# Patient Record
Sex: Female | Born: 1950 | Race: White | Hispanic: No | Marital: Married | Smoking: Never smoker
Health system: Southern US, Community
[De-identification: ages and names within clinical notes are randomized; demographics above are authoritative.]

## PROBLEM LIST (undated history)

## (undated) HISTORY — PX: OVARIAN CYST REMOVAL: SHX89

## (undated) HISTORY — PX: PROLAPSED UTERINE FIBROID LIGATION: SHX5400

---

## 1999-03-27 ENCOUNTER — Encounter: Payer: Self-pay | Admitting: Family Medicine

## 1999-03-27 ENCOUNTER — Encounter: Admission: RE | Admit: 1999-03-27 | Discharge: 1999-03-27 | Payer: Self-pay | Admitting: Family Medicine

## 2000-08-04 ENCOUNTER — Encounter: Payer: Self-pay | Admitting: Family Medicine

## 2000-08-04 ENCOUNTER — Encounter: Admission: RE | Admit: 2000-08-04 | Discharge: 2000-08-04 | Payer: Self-pay | Admitting: Family Medicine

## 2000-08-06 ENCOUNTER — Encounter: Payer: Self-pay | Admitting: Family Medicine

## 2000-08-06 ENCOUNTER — Encounter: Admission: RE | Admit: 2000-08-06 | Discharge: 2000-08-06 | Payer: Self-pay | Admitting: Family Medicine

## 2001-10-05 ENCOUNTER — Encounter: Admission: RE | Admit: 2001-10-05 | Discharge: 2001-10-05 | Payer: Self-pay | Admitting: Family Medicine

## 2001-10-05 ENCOUNTER — Encounter: Payer: Self-pay | Admitting: Family Medicine

## 2002-10-12 ENCOUNTER — Encounter: Admission: RE | Admit: 2002-10-12 | Discharge: 2002-10-12 | Payer: Self-pay | Admitting: Family Medicine

## 2002-10-12 ENCOUNTER — Encounter: Payer: Self-pay | Admitting: Family Medicine

## 2004-03-13 ENCOUNTER — Other Ambulatory Visit: Admission: RE | Admit: 2004-03-13 | Discharge: 2004-03-13 | Payer: Self-pay | Admitting: Family Medicine

## 2004-03-18 ENCOUNTER — Encounter: Admission: RE | Admit: 2004-03-18 | Discharge: 2004-03-18 | Payer: Self-pay | Admitting: Family Medicine

## 2004-04-07 ENCOUNTER — Emergency Department (HOSPITAL_COMMUNITY): Admission: EM | Admit: 2004-04-07 | Discharge: 2004-04-07 | Payer: Self-pay | Admitting: Emergency Medicine

## 2005-04-30 ENCOUNTER — Encounter: Admission: RE | Admit: 2005-04-30 | Discharge: 2005-04-30 | Payer: Self-pay | Admitting: Family Medicine

## 2005-05-12 ENCOUNTER — Other Ambulatory Visit: Admission: RE | Admit: 2005-05-12 | Discharge: 2005-05-12 | Payer: Self-pay | Admitting: Family Medicine

## 2005-05-28 ENCOUNTER — Encounter: Admission: RE | Admit: 2005-05-28 | Discharge: 2005-05-28 | Payer: Self-pay | Admitting: Family Medicine

## 2006-05-13 ENCOUNTER — Other Ambulatory Visit: Admission: RE | Admit: 2006-05-13 | Discharge: 2006-05-13 | Payer: Self-pay | Admitting: Family Medicine

## 2006-06-22 ENCOUNTER — Encounter: Admission: RE | Admit: 2006-06-22 | Discharge: 2006-06-22 | Payer: Self-pay | Admitting: Family Medicine

## 2007-05-27 ENCOUNTER — Other Ambulatory Visit: Admission: RE | Admit: 2007-05-27 | Discharge: 2007-05-27 | Payer: Self-pay | Admitting: Family Medicine

## 2007-08-04 ENCOUNTER — Encounter: Admission: RE | Admit: 2007-08-04 | Discharge: 2007-08-04 | Payer: Self-pay | Admitting: Family Medicine

## 2008-07-04 ENCOUNTER — Encounter: Admission: RE | Admit: 2008-07-04 | Discharge: 2008-07-04 | Payer: Self-pay | Admitting: Family Medicine

## 2009-01-24 ENCOUNTER — Encounter: Admission: RE | Admit: 2009-01-24 | Discharge: 2009-01-24 | Payer: Self-pay | Admitting: Family Medicine

## 2009-12-13 ENCOUNTER — Encounter: Admission: RE | Admit: 2009-12-13 | Discharge: 2009-12-13 | Payer: Self-pay | Admitting: Family Medicine

## 2010-04-11 ENCOUNTER — Emergency Department (HOSPITAL_COMMUNITY)
Admission: EM | Admit: 2010-04-11 | Discharge: 2010-04-12 | Payer: Self-pay | Source: Home / Self Care | Admitting: Emergency Medicine

## 2012-05-30 ENCOUNTER — Other Ambulatory Visit (HOSPITAL_COMMUNITY)
Admission: RE | Admit: 2012-05-30 | Discharge: 2012-05-30 | Disposition: A | Discharge status: Home / Self Care | Payer: Self-pay | Source: Ambulatory Visit | Attending: Family Medicine | Admitting: Family Medicine

## 2012-05-30 ENCOUNTER — Other Ambulatory Visit: Payer: Self-pay | Admitting: Family Medicine

## 2012-05-30 DIAGNOSIS — M810 Age-related osteoporosis without current pathological fracture: Secondary | ICD-10-CM

## 2012-05-30 DIAGNOSIS — Z124 Encounter for screening for malignant neoplasm of cervix: Secondary | ICD-10-CM | POA: Insufficient documentation

## 2012-06-23 ENCOUNTER — Other Ambulatory Visit: Payer: Self-pay

## 2012-06-27 ENCOUNTER — Other Ambulatory Visit: Payer: Self-pay

## 2012-07-05 ENCOUNTER — Ambulatory Visit
Admission: RE | Admit: 2012-07-05 | Discharge: 2012-07-05 | Disposition: A | Discharge status: Home / Self Care | Payer: BC Managed Care – PPO | Source: Ambulatory Visit | Attending: Family Medicine | Admitting: Family Medicine

## 2012-07-05 DIAGNOSIS — M810 Age-related osteoporosis without current pathological fracture: Secondary | ICD-10-CM

## 2013-03-06 ENCOUNTER — Other Ambulatory Visit: Payer: Self-pay

## 2013-03-06 DIAGNOSIS — Z1231 Encounter for screening mammogram for malignant neoplasm of breast: Secondary | ICD-10-CM

## 2013-03-27 ENCOUNTER — Ambulatory Visit
Admission: RE | Admit: 2013-03-27 | Discharge: 2013-03-27 | Disposition: A | Discharge status: Home / Self Care | Payer: BC Managed Care – PPO | Source: Ambulatory Visit

## 2013-03-27 DIAGNOSIS — Z1231 Encounter for screening mammogram for malignant neoplasm of breast: Secondary | ICD-10-CM

## 2013-05-31 ENCOUNTER — Other Ambulatory Visit: Payer: Self-pay | Admitting: Family Medicine

## 2013-05-31 ENCOUNTER — Ambulatory Visit
Admission: RE | Admit: 2013-05-31 | Discharge: 2013-05-31 | Disposition: A | Discharge status: Home / Self Care | Payer: BC Managed Care – PPO | Source: Ambulatory Visit | Attending: Family Medicine | Admitting: Family Medicine

## 2013-05-31 DIAGNOSIS — R52 Pain, unspecified: Secondary | ICD-10-CM

## 2015-05-31 ENCOUNTER — Other Ambulatory Visit: Payer: Self-pay

## 2015-05-31 DIAGNOSIS — Z1231 Encounter for screening mammogram for malignant neoplasm of breast: Secondary | ICD-10-CM

## 2015-06-12 ENCOUNTER — Ambulatory Visit
Admission: RE | Admit: 2015-06-12 | Discharge: 2015-06-12 | Disposition: A | Discharge status: Home / Self Care | Payer: BC Managed Care – PPO | Source: Ambulatory Visit

## 2015-06-12 DIAGNOSIS — Z1231 Encounter for screening mammogram for malignant neoplasm of breast: Secondary | ICD-10-CM

## 2016-11-24 ENCOUNTER — Other Ambulatory Visit (HOSPITAL_COMMUNITY)
Admission: RE | Admit: 2016-11-24 | Discharge: 2016-11-24 | Disposition: A | Discharge status: Home / Self Care | Payer: BC Managed Care – PPO | Source: Ambulatory Visit | Attending: Obstetrics and Gynecology | Admitting: Obstetrics and Gynecology

## 2016-11-24 ENCOUNTER — Other Ambulatory Visit: Payer: Self-pay | Admitting: Nurse Practitioner

## 2016-11-24 DIAGNOSIS — Z124 Encounter for screening for malignant neoplasm of cervix: Secondary | ICD-10-CM | POA: Diagnosis present

## 2016-11-27 LAB — CYTOLOGY - PAP
Diagnosis: NEGATIVE
HPV (WINDOPATH): NOT DETECTED

## 2017-10-04 ENCOUNTER — Other Ambulatory Visit: Payer: Self-pay | Admitting: Family Medicine

## 2017-10-04 DIAGNOSIS — Z1231 Encounter for screening mammogram for malignant neoplasm of breast: Secondary | ICD-10-CM

## 2017-10-28 ENCOUNTER — Ambulatory Visit
Admission: RE | Admit: 2017-10-28 | Discharge: 2017-10-28 | Disposition: A | Discharge status: Home / Self Care | Payer: BC Managed Care – PPO | Source: Ambulatory Visit | Attending: Family Medicine | Admitting: Family Medicine

## 2017-10-28 DIAGNOSIS — Z1231 Encounter for screening mammogram for malignant neoplasm of breast: Secondary | ICD-10-CM

## 2018-12-20 ENCOUNTER — Other Ambulatory Visit: Payer: Self-pay | Admitting: Family Medicine

## 2018-12-20 DIAGNOSIS — Z1231 Encounter for screening mammogram for malignant neoplasm of breast: Secondary | ICD-10-CM

## 2019-02-02 ENCOUNTER — Other Ambulatory Visit: Payer: Self-pay

## 2019-02-02 ENCOUNTER — Ambulatory Visit
Admission: RE | Admit: 2019-02-02 | Discharge: 2019-02-02 | Disposition: A | Discharge status: Home / Self Care | Payer: BC Managed Care – PPO | Source: Ambulatory Visit | Attending: Family Medicine | Admitting: Family Medicine

## 2019-02-02 DIAGNOSIS — Z1231 Encounter for screening mammogram for malignant neoplasm of breast: Secondary | ICD-10-CM

## 2020-01-26 ENCOUNTER — Other Ambulatory Visit: Payer: Self-pay | Admitting: Family Medicine

## 2020-01-26 DIAGNOSIS — Z1231 Encounter for screening mammogram for malignant neoplasm of breast: Secondary | ICD-10-CM

## 2020-02-15 ENCOUNTER — Ambulatory Visit: Payer: BC Managed Care – PPO

## 2020-02-19 ENCOUNTER — Other Ambulatory Visit: Payer: Self-pay | Admitting: Family Medicine

## 2020-02-19 DIAGNOSIS — E2839 Other primary ovarian failure: Secondary | ICD-10-CM

## 2020-02-20 ENCOUNTER — Other Ambulatory Visit: Payer: Self-pay

## 2020-02-20 ENCOUNTER — Other Ambulatory Visit: Payer: Self-pay | Admitting: Family Medicine

## 2020-02-20 ENCOUNTER — Ambulatory Visit
Admission: RE | Admit: 2020-02-20 | Discharge: 2020-02-20 | Disposition: A | Discharge status: Home / Self Care | Payer: BC Managed Care – PPO | Source: Ambulatory Visit | Attending: Family Medicine | Admitting: Family Medicine

## 2020-02-20 DIAGNOSIS — Z1231 Encounter for screening mammogram for malignant neoplasm of breast: Secondary | ICD-10-CM

## 2020-05-30 ENCOUNTER — Other Ambulatory Visit: Payer: BC Managed Care – PPO

## 2020-08-28 ENCOUNTER — Other Ambulatory Visit: Payer: BC Managed Care – PPO

## 2020-09-03 IMAGING — MG MM DIGITAL SCREENING BILAT W/ TOMO W/ CAD
6 of 10 series · 6 of 30 positions shown · non-contrast
Comparison: Previous exam(s).

CLINICAL DATA: Screening.

EXAM:
DIGITAL SCREENING BILATERAL MAMMOGRAM WITH TOMO AND CAD

[R CC synth-2D]
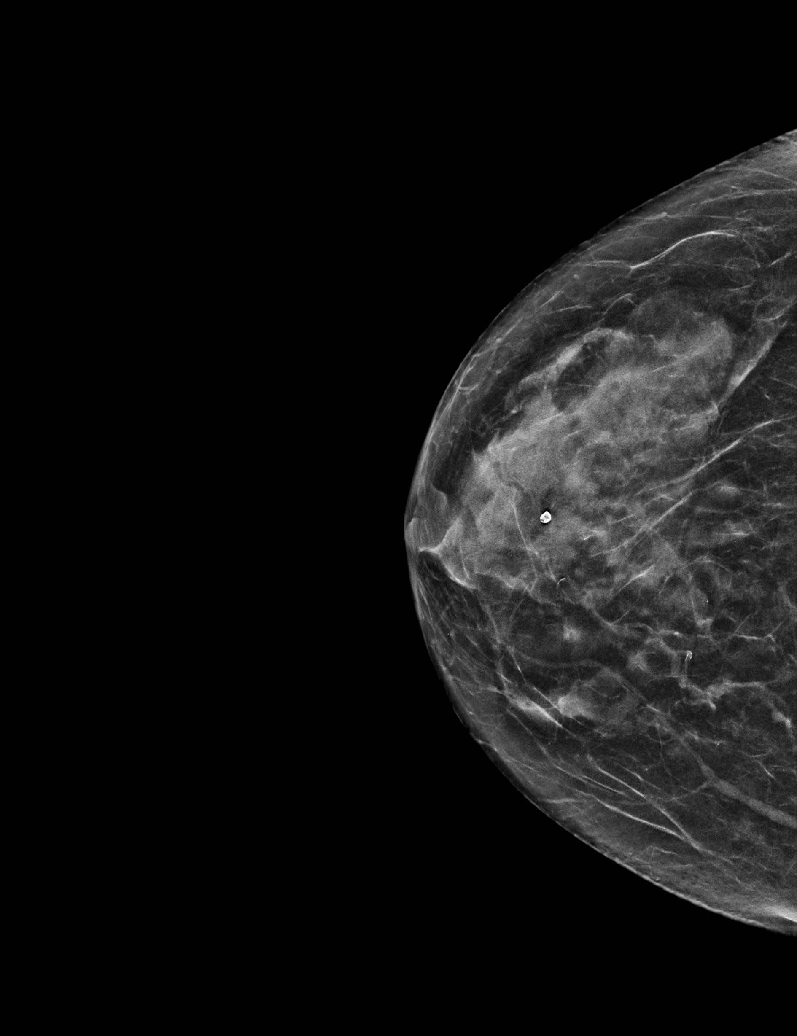

[R MLO synth-2D]
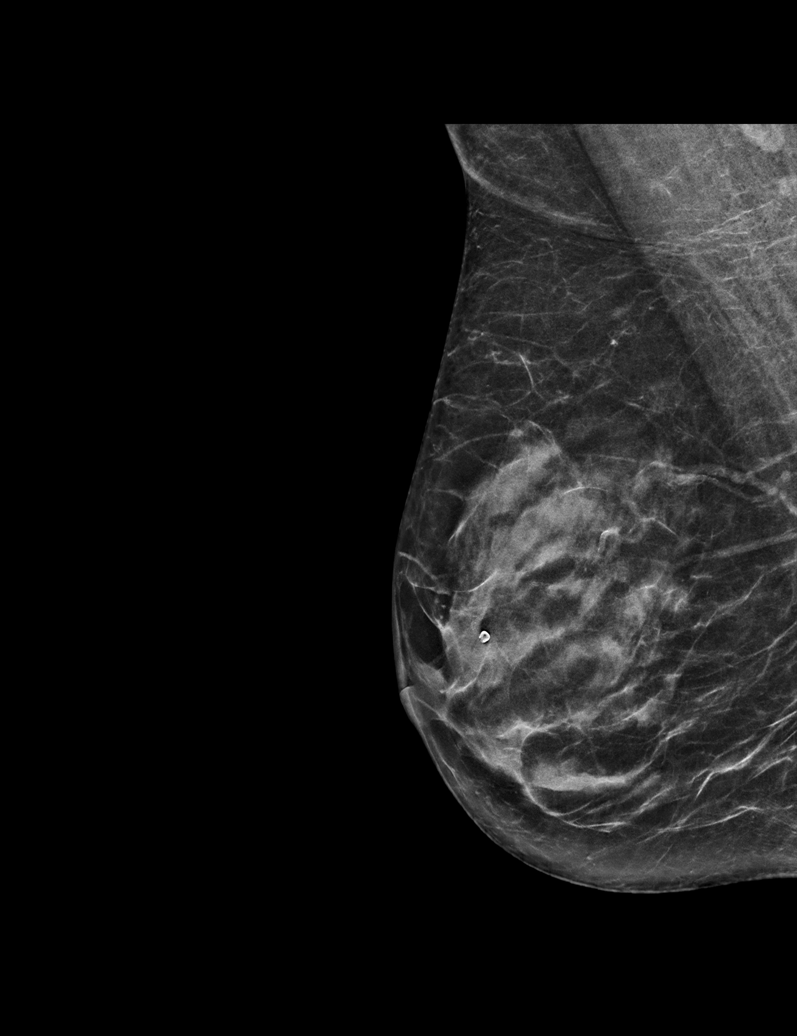

[L MLO synth-2D]
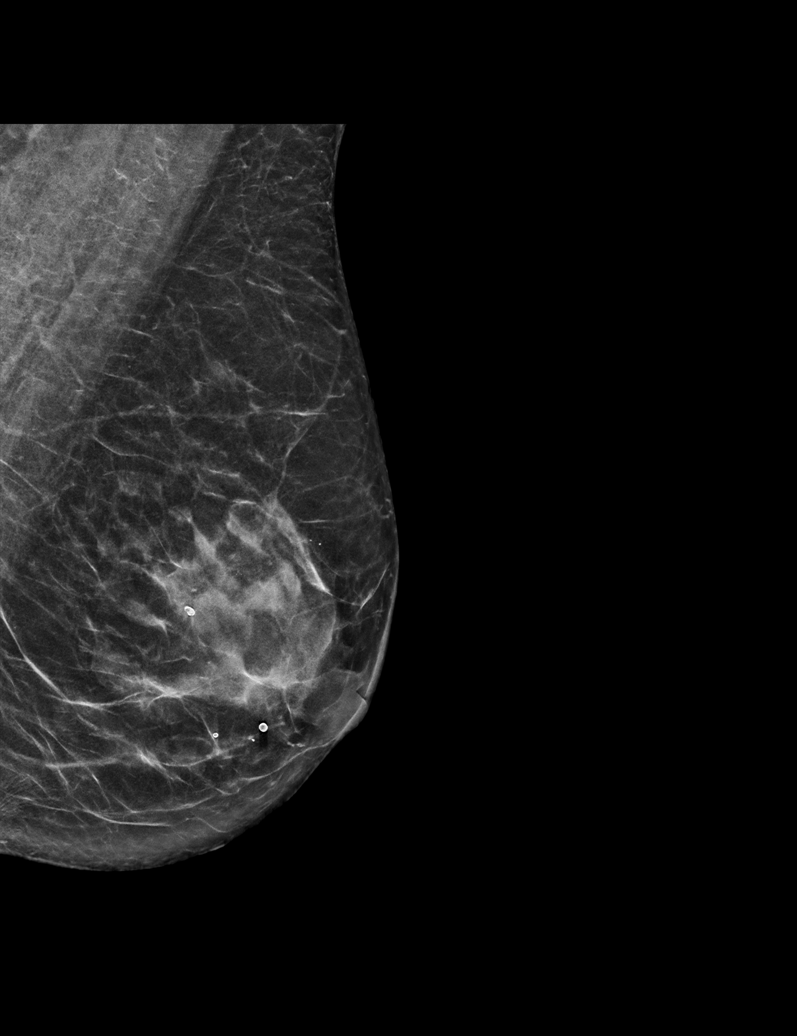

[L CC synth-2D (1 of 2)]
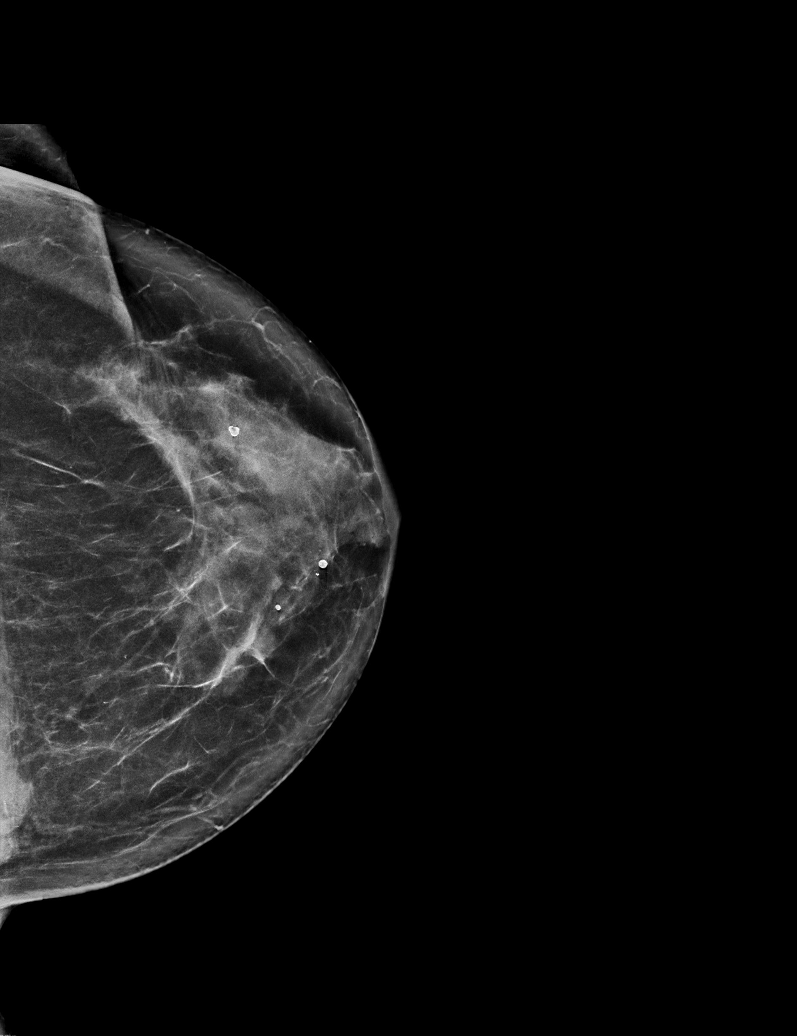

[L CC synth-2D (2 of 2)]
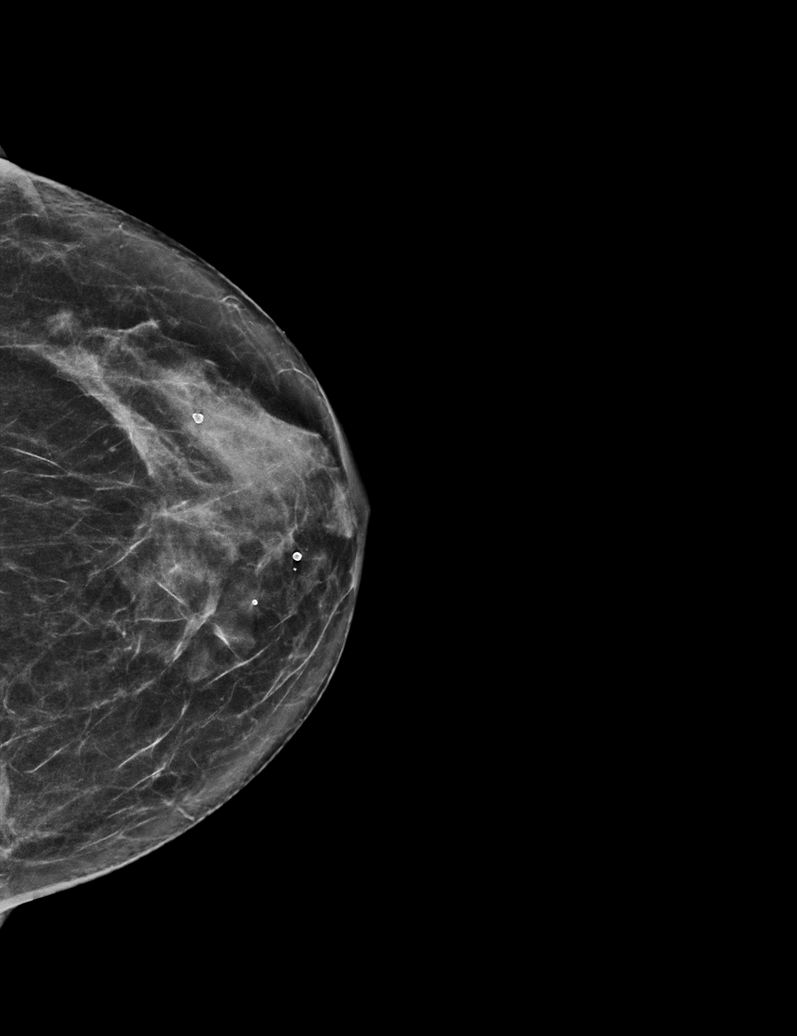

[R CC tomo · tomo slice 27/52.0]
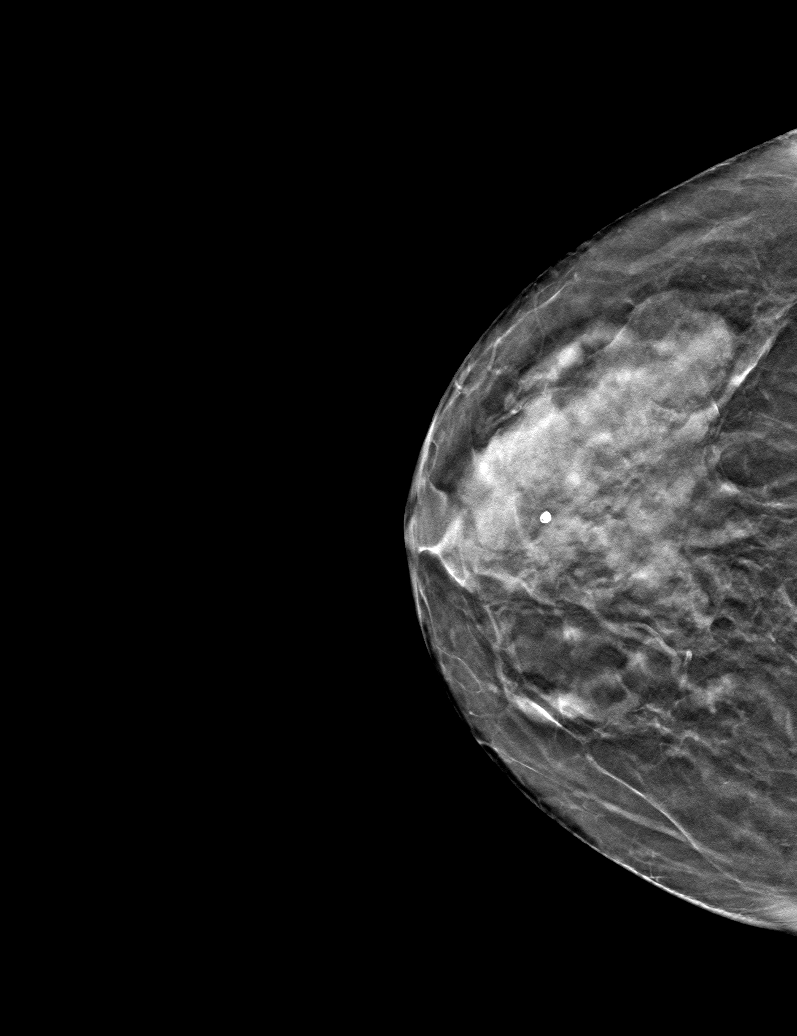

[6 of 30 positions shown; findings below may reference images not displayed]

ACR Breast Density Category c: The breast tissue is heterogeneously
dense, which may obscure small masses.
FINDINGS: There are no findings suspicious for malignancy. Images were
processed with CAD.
IMPRESSION: No mammographic evidence of malignancy. A result letter of this
screening mammogram will be mailed directly to the patient.

RECOMMENDATION:
Screening mammogram in one year. (Code:[id number])

BI-RADS CATEGORY  1: Negative.

## 2021-02-03 ENCOUNTER — Other Ambulatory Visit: Payer: Self-pay

## 2021-02-04 ENCOUNTER — Other Ambulatory Visit: Payer: Self-pay | Admitting: Family Medicine

## 2021-02-04 DIAGNOSIS — E2839 Other primary ovarian failure: Secondary | ICD-10-CM

## 2021-02-25 ENCOUNTER — Other Ambulatory Visit: Payer: Self-pay | Admitting: Family Medicine

## 2021-02-25 DIAGNOSIS — Z1231 Encounter for screening mammogram for malignant neoplasm of breast: Secondary | ICD-10-CM

## 2021-03-26 ENCOUNTER — Ambulatory Visit: Payer: Self-pay

## 2021-05-12 ENCOUNTER — Ambulatory Visit
Admission: RE | Admit: 2021-05-12 | Discharge: 2021-05-12 | Disposition: A | Discharge status: Home / Self Care | Payer: BC Managed Care – PPO | Source: Ambulatory Visit | Attending: Family Medicine | Admitting: Family Medicine

## 2021-05-12 DIAGNOSIS — Z1231 Encounter for screening mammogram for malignant neoplasm of breast: Secondary | ICD-10-CM

## 2021-07-17 ENCOUNTER — Ambulatory Visit
Admission: RE | Admit: 2021-07-17 | Discharge: 2021-07-17 | Disposition: A | Discharge status: Home / Self Care | Payer: BC Managed Care – PPO | Source: Ambulatory Visit | Attending: Family Medicine | Admitting: Family Medicine

## 2021-07-17 DIAGNOSIS — E2839 Other primary ovarian failure: Secondary | ICD-10-CM

## 2022-05-01 ENCOUNTER — Other Ambulatory Visit: Payer: Self-pay | Admitting: Family Medicine

## 2022-05-01 DIAGNOSIS — Z1231 Encounter for screening mammogram for malignant neoplasm of breast: Secondary | ICD-10-CM

## 2022-06-25 ENCOUNTER — Ambulatory Visit
Admission: RE | Admit: 2022-06-25 | Discharge: 2022-06-25 | Disposition: A | Discharge status: Home / Self Care | Payer: BC Managed Care – PPO | Source: Ambulatory Visit | Attending: Family Medicine | Admitting: Family Medicine

## 2022-06-25 DIAGNOSIS — Z1231 Encounter for screening mammogram for malignant neoplasm of breast: Secondary | ICD-10-CM

## 2022-11-09 ENCOUNTER — Ambulatory Visit: Payer: BC Managed Care – PPO | Admitting: Cardiology

## 2022-11-09 ENCOUNTER — Encounter: Payer: Self-pay | Admitting: Cardiology

## 2022-11-09 VITALS — BP 139/81 | HR 69 | Ht 65.0 in | Wt 139.0 lb

## 2022-11-09 DIAGNOSIS — R011 Cardiac murmur, unspecified: Secondary | ICD-10-CM

## 2022-11-09 DIAGNOSIS — E782 Mixed hyperlipidemia: Secondary | ICD-10-CM

## 2022-11-09 NOTE — Progress Notes (Signed)
Patient referred by Camie Patience, FNP for murmur  Subjective:   Brooke Taylor, female    DOB: 01-Nov-1950, 72 y.o.   MRN: [id number]   Chief Complaint  Patient presents with   Heart Murmur   New Patient (Initial Visit)   Pain     HPI  72 y.o. 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  She lives with her husband on 69 acre farm in [place], has 2 children, and dogs.  1 son lives in [place], the other lives in [place] currently.  Patient is very active, recently did of 5-day 220 mile bike ride in [place].  She does a lot of outdoor activity, exercises regularly through [employer], which includes high intensity interval exercises.  She has no complaints of chest pain, shortness of breath, presyncope, syncope.  She was recently found to have murmur on exam by PCP.  She is not noted to have murmur prior to this.  Her LDL has always been elevated.  She has never been on lipid-lowering therapy.  Her mother had cardiac history, but lived till 2.  Patient does not smoke.   No past medical history on file.   Past Surgical History:  Procedure Laterality Date   OVARIAN CYST REMOVAL     PROLAPSED UTERINE FIBROID LIGATION       Social History   Tobacco Use  Smoking Status Never  Smokeless Tobacco Never    Social History   Substance and Sexual Activity  Alcohol Use Never     Family History  Problem Relation Age of Onset   Congenital heart disease Mother    Hypertension Mother    Diabetes Mother    Breast cancer Neg Hx       Current Outpatient Medications:    alendronate (FOSAMAX) 70 MG tablet, Take 70 mg by mouth once a week. Take with a full glass of water on an empty stomach., Disp: , Rfl:    clobetasol cream (TEMOVATE) 0.05 %, Apply 1 Application topically 2 (two) times daily., Disp: , Rfl:    estradiol (ESTRACE) 0.1 MG/GM vaginal cream, Place 1 Applicatorful vaginally at bedtime., Disp:  , Rfl:    Estradiol (VAGIFEM) 10 MCG TABS vaginal tablet, Place vaginally., Disp: , Rfl:    Galcanezumab-gnlm (EMGALITY) 120 MG/ML SOAJ, Inject into the skin., Disp: , Rfl:    Cardiovascular and other pertinent studies:  Reviewed external labs and tests, independently interpreted  EKG 11/09/2022: Sinus rhythm 64 bpm  Leftward axis Nonsoecific T wave abnormality   Recent labs: 03/04/2022: Glucose 87, BUN/Cr 18/0.8. EGFR 75. K 4.0. Hb 13.7. HbA1C NA Chol 210, TG 76, HDL 65, LDL 131 TSH NA    Review of Systems  Cardiovascular:  Negative for chest pain, dyspnea on exertion, leg swelling, palpitations and syncope.         Vitals:   11/09/22 0949  BP: 139/81  Pulse: 69  SpO2: 100%     Body mass index is 23.13 kg/m. Filed Weights   11/09/22 0949  Weight: 139 lb (63 kg)     Objective:   Physical Exam Vitals and nursing note reviewed.  Constitutional:      General: She is not in acute distress. Neck:     Vascular: No JVD.  Cardiovascular:     Rate and Rhythm: Normal rate and regular rhythm.     Heart sounds: Murmur heard.     High-pitched  blowing holosystolic murmur is present with a grade of 3/6 at the apex.  Pulmonary:     Effort: Pulmonary effort is normal.     Breath sounds: Normal breath sounds. No wheezing or rales.           Visit diagnoses:   ICD-10-CM   1. Murmur, cardiac  R01.1 EKG 12-Lead    PCV ECHOCARDIOGRAM COMPLETE    2. Mixed hyperlipidemia  E78.2 CT CARDIAC SCORING (SELF PAY ONLY)       Orders Placed This Encounter  Procedures   CT CARDIAC SCORING (SELF PAY ONLY)   EKG 12-Lead   PCV ECHOCARDIOGRAM COMPLETE      Assessment & Recommendations:    72 y.o. Caucasian female with murmur, elevated LDL  Murmur: Most likely mitral valve prolapse causing mitral regurgitation.  She is completely asymptomatic at this time.  Will obtain echocardiogram.  Depending on findings, she will likely need at least annual surveillance  echocardiogram.  Mixed hyperlipidemia: HDL 65, LDL 131.  She lives fairly heart healthy diet and lifestyle.  Will check calcium score scan for risk stratification.  Further recommendations after above testing.  Thank you for referring the patient to [place]. Please feel free to contact with any questions.   Elder Negus, MD Pager: [phone] Office: [phone]

## 2022-11-28 ENCOUNTER — Encounter: Payer: Self-pay | Admitting: Cardiology

## 2022-11-30 NOTE — Telephone Encounter (Signed)
From pt

## 2022-12-02 ENCOUNTER — Ambulatory Visit: Payer: BC Managed Care – PPO

## 2022-12-02 ENCOUNTER — Ambulatory Visit (HOSPITAL_COMMUNITY)
Admission: RE | Admit: 2022-12-02 | Discharge: 2022-12-02 | Disposition: A | Discharge status: Home / Self Care | Payer: BC Managed Care – PPO | Source: Ambulatory Visit | Attending: Cardiology | Admitting: Cardiology

## 2022-12-02 DIAGNOSIS — R011 Cardiac murmur, unspecified: Secondary | ICD-10-CM

## 2022-12-02 DIAGNOSIS — E782 Mixed hyperlipidemia: Secondary | ICD-10-CM | POA: Insufficient documentation

## 2022-12-04 NOTE — Telephone Encounter (Signed)
From pt

## 2022-12-07 NOTE — Telephone Encounter (Signed)
 From patient.

## 2023-01-01 ENCOUNTER — Ambulatory Visit: Payer: BC Managed Care – PPO | Admitting: Cardiology

## 2023-04-20 ENCOUNTER — Other Ambulatory Visit: Payer: Self-pay | Admitting: Orthopedic Surgery

## 2023-04-20 DIAGNOSIS — M12819 Other specific arthropathies, not elsewhere classified, unspecified shoulder: Secondary | ICD-10-CM

## 2023-05-04 ENCOUNTER — Ambulatory Visit
Admission: RE | Admit: 2023-05-04 | Discharge: 2023-05-04 | Disposition: A | Discharge status: Home / Self Care | Payer: Medicare Other | Source: Ambulatory Visit | Attending: Orthopedic Surgery | Admitting: Orthopedic Surgery

## 2023-05-04 DIAGNOSIS — M12819 Other specific arthropathies, not elsewhere classified, unspecified shoulder: Secondary | ICD-10-CM

## 2023-06-08 DIAGNOSIS — Z1231 Encounter for screening mammogram for malignant neoplasm of breast: Secondary | ICD-10-CM

## 2023-06-28 ENCOUNTER — Ambulatory Visit
Admission: RE | Admit: 2023-06-28 | Discharge: 2023-06-28 | Disposition: A | Discharge status: Home / Self Care | Payer: Medicare Other | Source: Ambulatory Visit

## 2023-06-28 DIAGNOSIS — Z1231 Encounter for screening mammogram for malignant neoplasm of breast: Secondary | ICD-10-CM

## 2023-11-10 ENCOUNTER — Ambulatory Visit: Payer: Self-pay | Admitting: Cardiology

## 2024-05-22 ENCOUNTER — Other Ambulatory Visit: Payer: Self-pay | Admitting: Family Medicine

## 2024-05-22 DIAGNOSIS — Z1231 Encounter for screening mammogram for malignant neoplasm of breast: Secondary | ICD-10-CM

## 2024-06-28 ENCOUNTER — Ambulatory Visit
Admission: RE | Admit: 2024-06-28 | Discharge: 2024-06-28 | Disposition: A | Source: Ambulatory Visit | Attending: Family Medicine | Admitting: Family Medicine

## 2024-06-28 DIAGNOSIS — Z1231 Encounter for screening mammogram for malignant neoplasm of breast: Secondary | ICD-10-CM
